# Patient Record
Sex: Male | Born: 1990 | Race: Black or African American | Hispanic: No | Marital: Single | Smoking: Current every day smoker
Health system: Southern US, Community
[De-identification: ages and names within clinical notes are randomized; demographics above are authoritative.]

## PROBLEM LIST (undated history)

## (undated) HISTORY — PX: OTHER SURGICAL HISTORY: SHX169

---
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
## 2014-06-04 ENCOUNTER — Emergency Department (HOSPITAL_COMMUNITY): Payer: Self-pay

## 2014-06-04 ENCOUNTER — Emergency Department (HOSPITAL_COMMUNITY)
Admission: EM | Admit: 2014-06-04 | Discharge: 2014-06-05 | Disposition: A | Discharge status: Home / Self Care | Payer: Self-pay | Attending: Emergency Medicine | Admitting: Emergency Medicine

## 2014-06-04 ENCOUNTER — Encounter (HOSPITAL_COMMUNITY): Payer: Self-pay | Admitting: Emergency Medicine

## 2014-06-04 DIAGNOSIS — F17200 Nicotine dependence, unspecified, uncomplicated: Secondary | ICD-10-CM | POA: Insufficient documentation

## 2014-06-04 DIAGNOSIS — J189 Pneumonia, unspecified organism: Secondary | ICD-10-CM

## 2014-06-04 DIAGNOSIS — J159 Unspecified bacterial pneumonia: Secondary | ICD-10-CM | POA: Insufficient documentation

## 2014-06-04 DIAGNOSIS — R042 Hemoptysis: Secondary | ICD-10-CM | POA: Insufficient documentation

## 2014-06-04 LAB — URINALYSIS, ROUTINE W REFLEX MICROSCOPIC
Bilirubin Urine: NEGATIVE
Glucose, UA: NEGATIVE mg/dL
Hgb urine dipstick: NEGATIVE
Ketones, ur: NEGATIVE mg/dL
Leukocytes, UA: NEGATIVE
Nitrite: NEGATIVE
Protein, ur: NEGATIVE mg/dL
Specific Gravity, Urine: 1.024 (ref 1.005–1.030)
Urobilinogen, UA: 0.2 mg/dL (ref 0.0–1.0)
pH: 7 (ref 5.0–8.0)

## 2014-06-04 LAB — COMPREHENSIVE METABOLIC PANEL
ALT: 14 U/L (ref 0–53)
AST: 14 U/L (ref 0–37)
Albumin: 3.4 g/dL — ABNORMAL LOW (ref 3.5–5.2)
Alkaline Phosphatase: 63 U/L (ref 39–117)
Anion gap: 12 (ref 5–15)
BUN: 11 mg/dL (ref 6–23)
CO2: 26 mEq/L (ref 19–32)
Calcium: 9.4 mg/dL (ref 8.4–10.5)
Chloride: 101 mEq/L (ref 96–112)
Creatinine, Ser: 0.77 mg/dL (ref 0.50–1.35)
GFR calc Af Amer: >90 mL/min (ref >90)
GFR calc non Af Amer: >90 mL/min (ref >90)
Glucose, Bld: 87 mg/dL (ref 70–99)
Potassium: 3.8 mEq/L (ref 3.7–5.3)
Sodium: 139 mEq/L (ref 137–147)
Total Bilirubin: <0.2 mg/dL — ABNORMAL LOW (ref 0.3–1.2)
Total Protein: 7.1 g/dL (ref 6.0–8.3)

## 2014-06-04 LAB — CBC WITH DIFFERENTIAL/PLATELET
Basophils Absolute: 0 10*3/uL (ref 0.0–0.1)
Basophils Relative: 0 % (ref 0–1)
Eosinophils Absolute: 0.2 10*3/uL (ref 0.0–0.7)
Eosinophils Relative: 4 % (ref 0–5)
HCT: 40.7 % (ref 39.0–52.0)
Hemoglobin: 13.7 g/dL (ref 13.0–17.0)
Lymphocytes Relative: 32 % (ref 12–46)
Lymphs Abs: 2.1 10*3/uL (ref 0.7–4.0)
MCH: 28.4 pg (ref 26.0–34.0)
MCHC: 33.7 g/dL (ref 30.0–36.0)
MCV: 84.4 fL (ref 78.0–100.0)
Monocytes Absolute: 0.4 10*3/uL (ref 0.1–1.0)
Monocytes Relative: 6 % (ref 3–12)
Neutro Abs: 3.9 10*3/uL (ref 1.7–7.7)
Neutrophils Relative %: 58 % (ref 43–77)
Platelets: 317 10*3/uL (ref 150–400)
RBC: 4.82 MIL/uL (ref 4.22–5.81)
RDW: 13 % (ref 11.5–15.5)
WBC: 6.7 10*3/uL (ref 4.0–10.5)

## 2014-06-04 LAB — LIPASE, BLOOD: Lipase: 12 U/L (ref 11–59)

## 2014-06-04 MED ORDER — IOHEXOL 300 MG/ML  SOLN
80.0000 mL | Freq: Once | INTRAMUSCULAR | Status: AC | PRN
  Administered 2014-06-04: 80 mL via INTRAVENOUS

## 2014-06-04 MED ORDER — DEXTROSE 5 % IV SOLN
1.0000 g | Freq: Once | INTRAVENOUS | Status: AC
  Administered 2014-06-05: 1 g via INTRAVENOUS
  Filled 2014-06-04: qty 10

## 2014-06-04 NOTE — ED Notes (Signed)
Patient transported to CT 

## 2014-06-04 NOTE — ED Notes (Signed)
Pt presents with a productive cough with clear colored sputum and intermittent episodes of "coughing up blood clots." Pt also endorsees SOB with and with out exertion, nausea, fatigue, night sweats, poor po intake starting February 2015. Pt reports he started working in a [employer] facility in May but symptoms were already in effect. Pt is a daily smoker

## 2014-06-04 NOTE — ED Notes (Signed)
The pt is c/o chest congestion  With anterior  And posterior pain with coughing up blood  Since may when he had a cold

## 2014-06-04 NOTE — ED Notes (Signed)
Patient transported to X-ray 

## 2014-06-05 MED ORDER — AZITHROMYCIN 250 MG PO TABS: ORAL_TABLET | ORAL | Status: DC

## 2014-06-05 MED ORDER — PROMETHAZINE-DM 6.25-15 MG/5ML PO SYRP: 5.0000 mL | ORAL_SOLUTION | Freq: Four times a day (QID) | ORAL | Status: DC | PRN

## 2014-06-05 MED ORDER — GUAIFENESIN ER 1200 MG PO TB12: 1.0000 | ORAL_TABLET | Freq: Two times a day (BID) | ORAL | Status: DC

## 2014-06-05 NOTE — ED Provider Notes (Signed)
Medical screening examination/treatment/procedure(s) were performed by non-physician practitioner and as supervising physician I was immediately available for consultation/collaboration.   EKG Interpretation None        Layla Maw Ward, DO 06/05/14 1433

## 2014-06-05 NOTE — ED Provider Notes (Signed)
CSN: [id number]     Arrival date & time 06/04/14  1911 History   First MD Initiated Contact with Patient 06/04/14 2036     Chief Complaint  Patient presents with  . Hemoptysis     (Consider location/radiation/quality/duration/timing/severity/associated sxs/prior Treatment) HPI Patient presents to the emergency department with chest congestion and pain with coughing.  Patient, states she's also been coughing up blood.  The patient, states, that he had multiple infections over the winter and in May had a significant upper respiratory illness.  Patient, states, that he's also had some intermittent bleeding, in his stool.  Patient, states, that he has not had shortness of breath, nausea, vomiting, fever, diarrhea, weakness, dizziness, lightheadedness, rash, or syncope.  Nothing seems make the patient's condition, better or worse History reviewed. No pertinent past medical history. History reviewed. No pertinent past surgical history. No family history on file. History  Substance Use Topics  . Smoking status: Current Every Day Smoker  . Smokeless tobacco: Not on file  . Alcohol Use: Yes    Review of Systems All other systems negative except as documented in the HPI. All pertinent positives and negatives as reviewed in the HPI.   Allergies  Review of patient's allergies indicates no known allergies.  Home Medications   Prior to Admission medications   Medication Sig Start Date End Date Taking? Authorizing Provider  Chlorphen-Pseudoephed-APAP (COLD MEDICINE PLUS PO) Take 2 capsules by mouth daily as needed (for cold).   Yes Historical Provider, MD   BP 114/74  Pulse 49  Temp(Src) 97.6 F (36.4 C) (Oral)  Resp 16  Ht 6' (1.829 m)  Wt 150 lb (68.04 kg)  BMI 20.34 kg/m2  SpO2 100% Physical Exam  Nursing note and vitals reviewed. Constitutional: He is oriented to person, place, and time. He appears well-developed and well-nourished. No distress.  HENT:  Head: Normocephalic and  atraumatic.  Mouth/Throat: Oropharynx is clear and moist.  Eyes: Pupils are equal, round, and reactive to light.  Neck: Normal range of motion. Neck supple.  Cardiovascular: Normal rate, regular rhythm and normal heart sounds.  Exam reveals no gallop and no friction rub.   No murmur heard. Pulmonary/Chest: Effort normal and breath sounds normal. No respiratory distress. He has no wheezes. He has no rales.  Neurological: He is alert and oriented to person, place, and time.  Skin: Skin is warm and dry. No rash noted. No erythema.    ED Course  Procedures (including critical care time) Labs Review Labs Reviewed  COMPREHENSIVE METABOLIC PANEL - Abnormal; Notable for the following:    Albumin 3.4 (*)    Total Bilirubin <0.2 (*)    All other components within normal limits  LIPASE, BLOOD  CBC WITH DIFFERENTIAL  URINALYSIS, ROUTINE W REFLEX MICROSCOPIC    Imaging Review Dg Chest 2 View  06/04/2014   CLINICAL DATA:  Hemoptysis since May 2015, chest pain with coughing  EXAM: CHEST  2 VIEW  COMPARISON:  11/14/2008  FINDINGS: The heart size and vascular pattern are normal. Left lung is clear. There is infiltrate in the anterior right middle lobe which was not previously present. No pleural effusions.  IMPRESSION: Right middle lobe pneumonia. Radiographic follow-up after appropriate therapy recommended to ensure resolution. Given the clinical history, the possibility of postobstructive pneumonitis is not excluded in could be further investigated with CT scan of the thorax, preferably with contrast.   Electronically Signed   By: Esperanza Heir M.D.   On: 06/04/2014 21:35   Ct  Chest W Contrast  06/04/2014   CLINICAL DATA:  Productive cough, intermittent hemoptysis. Shortness of breath. Fatigue.  EXAM: CT CHEST WITH CONTRAST  TECHNIQUE: Multidetector CT imaging of the chest was performed during intravenous contrast administration.  CONTRAST:  80mL OMNIPAQUE IOHEXOL 300 MG/ML  SOLN  COMPARISON:  Chest  radiograph June 04, 2014  FINDINGS: Heart and pericardium are unremarkable. Thoracic aorta is normal course and caliber, 2 vessel aortic arch is a normal variant. No lymphadenopathy by CT size criteria.  Tracheobronchial tree is patent, no pneumothorax. Trace bronchial wall thickening. No endobronchial lesions. Small area ground-glass patchy opacity in RIGHT upper lobe. Dense consolidation RIGHT middle lobe is seen on prior radiograph. Patchy areas of ground-glass opacities in the RIGHT lower lobe and lingula. No pleural effusions.  Included view of the abdomen is unremarkable. Visualized soft tissues and included osseous structures appear normal.  IMPRESSION: RIGHT greater than LEFT lung multifocal bronchopneumonia.   Electronically Signed   By: Awilda Metro   On: 06/04/2014 23:30    Patient will be antibiotics and followup with the [facility].  His best return here as needed.  Told to increase his fluid intake, and rest as much as possible.  Patient has been stable here in the emergency department and in no acute distress    Carlyle Dolly, PA-C 06/05/14 [id number]

## 2014-06-05 NOTE — Discharge Instructions (Signed)
Return here as needed.  Followup with the clinic provided.  Increase your fluid intake, and rest as much as possible

## 2014-06-20 ENCOUNTER — Encounter: Payer: Self-pay | Admitting: Internal Medicine

## 2014-06-20 ENCOUNTER — Ambulatory Visit: Payer: Self-pay | Attending: Internal Medicine | Admitting: Internal Medicine

## 2014-06-20 VITALS — BP 123/79 | HR 71 | Temp 98.3°F | Resp 16 | Wt 150.2 lb

## 2014-06-20 DIAGNOSIS — F17200 Nicotine dependence, unspecified, uncomplicated: Secondary | ICD-10-CM

## 2014-06-20 DIAGNOSIS — Z23 Encounter for immunization: Secondary | ICD-10-CM | POA: Insufficient documentation

## 2014-06-20 DIAGNOSIS — J189 Pneumonia, unspecified organism: Secondary | ICD-10-CM

## 2014-06-20 DIAGNOSIS — Z8701 Personal history of pneumonia (recurrent): Secondary | ICD-10-CM | POA: Insufficient documentation

## 2014-06-20 DIAGNOSIS — IMO0001 Reserved for inherently not codable concepts without codable children: Secondary | ICD-10-CM | POA: Insufficient documentation

## 2014-06-20 DIAGNOSIS — F17210 Nicotine dependence, cigarettes, uncomplicated: Secondary | ICD-10-CM | POA: Insufficient documentation

## 2014-06-20 DIAGNOSIS — Z720 Tobacco use: Secondary | ICD-10-CM

## 2014-06-20 DIAGNOSIS — Z139 Encounter for screening, unspecified: Secondary | ICD-10-CM

## 2014-06-20 LAB — COMPLETE METABOLIC PANEL WITH GFR
ALBUMIN: 4.7 g/dL (ref 3.5–5.2)
ALT: 13 U/L (ref 0–53)
AST: 15 U/L (ref 0–37)
Alkaline Phosphatase: 65 U/L (ref 39–117)
BUN: 7 mg/dL (ref 6–23)
CALCIUM: 9.7 mg/dL (ref 8.4–10.5)
CHLORIDE: 100 meq/L (ref 96–112)
CO2: 31 meq/L (ref 19–32)
Creat: 1.03 mg/dL (ref 0.50–1.35)
GFR, Est Non African American: >89 mL/min
Glucose, Bld: 79 mg/dL (ref 70–99)
POTASSIUM: 4.4 meq/L (ref 3.5–5.3)
Sodium: 139 mEq/L (ref 135–145)
Total Bilirubin: 0.5 mg/dL (ref 0.2–1.2)
Total Protein: 7.4 g/dL (ref 6.0–8.3)

## 2014-06-20 LAB — LIPID PANEL
CHOL/HDL RATIO: 1.8 ratio
Cholesterol: 143 mg/dL (ref 0–200)
HDL: 79 mg/dL (ref >39)
LDL Cholesterol: 49 mg/dL (ref 0–99)
Triglycerides: 74 mg/dL (ref <150)
VLDL: 15 mg/dL (ref 0–40)

## 2014-06-20 MED ORDER — NICOTINE 21 MG/24HR TD PT24: 21.0000 mg | MEDICATED_PATCH | Freq: Every day | TRANSDERMAL | Status: DC

## 2014-06-20 NOTE — Progress Notes (Signed)
Patient here to establish care Patient was recently seen in the ed for pneumonia

## 2014-06-20 NOTE — Progress Notes (Signed)
Patient Demographics  James Rosales, is a 23 y.o. male  ZOX:[id number]:[id number]  [id number]  DOB - 08/24/1991  CC:  Chief Complaint  Patient presents with  . Establish Care       HPI: James Rosales is a 23 y.o. male here today to establish medical care. Last month patient went to the emergency room with symptoms of fever chills coughing hemoptysis, EMR reviewed, patient had a chest x-ray and CT scan done was diagnosed with community-acquired pneumonia and was prescribed azithromycin which she has already completed denies any fever chills reports improvement in the symptoms patient does smoke cigarettes have advised patient to quit smoking. Patient has No headache, No chest pain, No abdominal pain - No Nausea, No new weakness tingling or numbness, No Cough - SOB.  No Known Allergies History reviewed. No pertinent past medical history. Current Outpatient Prescriptions on File Prior to Visit  Medication Sig Dispense Refill  . azithromycin (ZITHROMAX) 250 MG tablet 2 PO day and 1 PO day 2-5  6 tablet  0  . Chlorphen-Pseudoephed-APAP (COLD MEDICINE PLUS PO) Take 2 capsules by mouth daily as needed (for cold).      . Guaifenesin 1200 MG TB12 Take 1 tablet (1,200 mg total) by mouth 2 (two) times daily.  20 each  0  . promethazine-dextromethorphan (PROMETHAZINE-DM) 6.25-15 MG/5ML syrup Take 5 mLs by mouth 4 (four) times daily as needed for cough.  120 mL  0   No current facility-administered medications on file prior to visit.   Family History  Problem Relation Age of Onset  . Cancer Father     lung cancer   . Stroke Maternal Uncle   . Cancer Maternal Grandmother   . Cancer Paternal Grandmother    History   Social History  . Marital Status: Single    Spouse Name: N/A    Number of Children: N/A  . Years of Education: N/A   Occupational History  . Not on file.   Social History Main Topics  . Smoking status: Current Every Day Smoker -- 0.50 packs/day  . Smokeless tobacco: Not  on file  . Alcohol Use: Yes  . Drug Use: Not on file  . Sexual Activity: Not on file   Other Topics Concern  . Not on file   Social History Narrative  . No narrative on file    Review of Systems: Constitutional: Negative for fever, chills, diaphoresis, activity change, appetite change and fatigue. HENT: Negative for ear pain, nosebleeds, congestion, facial swelling, rhinorrhea, neck pain, neck stiffness and ear discharge.  Eyes: Negative for pain, discharge, redness, itching and visual disturbance. Respiratory: Negative for cough, choking, chest tightness, shortness of breath, wheezing and stridor.  Cardiovascular: Negative for chest pain, palpitations and leg swelling. Gastrointestinal: Negative for abdominal distention. Genitourinary: Negative for dysuria, urgency, frequency, hematuria, flank pain, decreased urine volume, difficulty urinating and dyspareunia.  Musculoskeletal: Negative for back pain, joint swelling, arthralgia and gait problem. Neurological: Negative for dizziness, tremors, seizures, syncope, facial asymmetry, speech difficulty, weakness, light-headedness, numbness and headaches.  Hematological: Negative for adenopathy. Does not bruise/bleed easily. Psychiatric/Behavioral: Negative for hallucinations, behavioral problems, confusion, dysphoric mood, decreased concentration and agitation.    Objective:   Filed Vitals:   06/20/14 1530  BP: 123/79  Pulse: 71  Temp: 98.3 F (36.8 C)  Resp: 16    Physical Exam: Constitutional: Patient appears well-developed and well-nourished. No distress. HENT: Normocephalic, atraumatic, External right and left ear normal. Oropharynx is clear and moist.  Eyes:  Conjunctivae and EOM are normal. PERRLA, no scleral icterus. Neck: Normal ROM. Neck supple. No JVD. No tracheal deviation. No thyromegaly. CVS: RRR, S1/S2 +, no murmurs, no gallops, no carotid bruit.  Pulmonary: Effort and breath sounds normal, no stridor, rhonchi,  wheezes, rales.  Abdominal: Soft. BS +, no distension, tenderness, rebound or guarding.  Musculoskeletal: Normal range of motion. No edema and no tenderness.  Neuro: Alert. Normal reflexes, muscle tone coordination. No cranial nerve deficit. Skin: Skin is warm and dry. No rash noted. Not diaphoretic. No erythema. No pallor. Psychiatric: Normal mood and affect. Behavior, judgment, thought content normal.  Lab Results  Component Value Date   WBC 6.7 06/04/2014   HGB 13.7 06/04/2014   HCT 40.7 06/04/2014   MCV 84.4 06/04/2014   PLT 317 06/04/2014   Lab Results  Component Value Date   CREATININE 0.77 06/04/2014   BUN 11 06/04/2014   NA 139 06/04/2014   K 3.8 06/04/2014   CL 101 06/04/2014   CO2 26 06/04/2014    No results found for this basename: HGBA1C   Lipid Panel  No results found for this basename: chol, trig, hdl, cholhdl, vldl, ldlcalc       Assessment and plan:   1. Encounter for immunization Patient was given flu shot today.  2. CAP (community acquired pneumonia) Patient already completed a course of antibiotic vitals stable afebrile.  3. Smoking Prescribe nicotine patch. - nicotine (NICODERM CQ) 21 mg/24hr patch; Place 1 patch (21 mg total) onto the skin daily.  Dispense: 28 patch; Refill: 0  4. Screening Ordered baseline blood work. - COMPLETE METABOLIC PANEL WITH GFR - TSH - Lipid panel - Vit D  25 hydroxy (rtn osteoporosis monitoring) - Hemoglobin A1c     Health Maintenance   -Influenza shot given today   Return in about 3 months (around 09/20/2014).    Doris Cheadle, MD

## 2014-06-20 NOTE — Patient Instructions (Signed)
Smoking Cessation Quitting smoking is important to your health and has many advantages. However, it is not always easy to quit since nicotine is a very addictive drug. Oftentimes, people try 3 times or more before being able to quit. This document explains the best ways for you to prepare to quit smoking. Quitting takes hard work and a lot of effort, but you can do it. ADVANTAGES OF QUITTING SMOKING  You will live longer, feel better, and live better.  Your body will feel the impact of quitting smoking almost immediately.  Within 20 minutes, blood pressure decreases. Your pulse returns to its normal level.  After 8 hours, carbon monoxide levels in the blood return to normal. Your oxygen level increases.  After 24 hours, the chance of having a heart attack starts to decrease. Your breath, hair, and body stop smelling like smoke.  After 48 hours, damaged nerve endings begin to recover. Your sense of taste and smell improve.  After 72 hours, the body is virtually free of nicotine. Your bronchial tubes relax and breathing becomes easier.  After 2 to 12 weeks, lungs can hold more air. Exercise becomes easier and circulation improves.  The risk of having a heart attack, stroke, cancer, or lung disease is greatly reduced.  After 1 year, the risk of coronary heart disease is cut in half.  After 5 years, the risk of stroke falls to the same as a nonsmoker.  After 10 years, the risk of lung cancer is cut in half and the risk of other cancers decreases significantly.  After 15 years, the risk of coronary heart disease drops, usually to the level of a nonsmoker.  If you are pregnant, quitting smoking will improve your chances of having a healthy baby.  The people you live with, especially any children, will be healthier.  You will have extra money to spend on things other than cigarettes. QUESTIONS TO THINK ABOUT BEFORE ATTEMPTING TO QUIT You may want to talk about your answers with your  health care provider.  Why do you want to quit?  If you tried to quit in the past, what helped and what did not?  What will be the most difficult situations for you after you quit? How will you plan to handle them?  Who can help you through the tough times? Your family? Friends? A health care provider?  What pleasures do you get from smoking? What ways can you still get pleasure if you quit? Here are some questions to ask your health care provider:  How can you help me to be successful at quitting?  What medicine do you think would be best for me and how should I take it?  What should I do if I need more help?  What is smoking withdrawal like? How can I get information on withdrawal? GET READY  Set a quit date.  Change your environment by getting rid of all cigarettes, ashtrays, matches, and lighters in your home, car, or work. Do not let people smoke in your home.  Review your past attempts to quit. Think about what worked and what did not. GET SUPPORT AND ENCOURAGEMENT You have a better chance of being successful if you have help. You can get support in many ways.  Tell your family, friends, and coworkers that you are going to quit and need their support. Ask them not to smoke around you.  Get individual, group, or telephone counseling and support. Programs are available at local hospitals and health centers. Call   your local health department for information about programs in your area.  Spiritual beliefs and practices may help some smokers quit.  Download a "quit meter" on your computer to keep track of quit statistics, such as how long you have gone without smoking, cigarettes not smoked, and money saved.  Get a self-help book about quitting smoking and staying off tobacco. LEARN NEW SKILLS AND BEHAVIORS  Distract yourself from urges to smoke. Talk to someone, go for a walk, or occupy your time with a task.  Change your normal routine. Take a different route to work.  Drink tea instead of coffee. Eat breakfast in a different place.  Reduce your stress. Take a hot bath, exercise, or read a book.  Plan something enjoyable to do every day. Reward yourself for not smoking.  Explore interactive web-based programs that specialize in helping you quit. GET MEDICINE AND USE IT CORRECTLY Medicines can help you stop smoking and decrease the urge to smoke. Combining medicine with the above behavioral methods and support can greatly increase your chances of successfully quitting smoking.  Nicotine replacement therapy helps deliver nicotine to your body without the negative effects and risks of smoking. Nicotine replacement therapy includes nicotine gum, lozenges, inhalers, nasal sprays, and skin patches. Some may be available over-the-counter and others require a prescription.  Antidepressant medicine helps people abstain from smoking, but how this works is unknown. This medicine is available by prescription.  Nicotinic receptor partial agonist medicine simulates the effect of nicotine in your brain. This medicine is available by prescription. Ask your health care provider for advice about which medicines to use and how to use them based on your health history. Your health care provider will tell you what side effects to look out for if you choose to be on a medicine or therapy. Carefully read the information on the package. Do not use any other product containing nicotine while using a nicotine replacement product.  RELAPSE OR DIFFICULT SITUATIONS Most relapses occur within the first 3 months after quitting. Do not be discouraged if you start smoking again. Remember, most people try several times before finally quitting. You may have symptoms of withdrawal because your body is used to nicotine. You may crave cigarettes, be irritable, feel very hungry, cough often, get headaches, or have difficulty concentrating. The withdrawal symptoms are only temporary. They are strongest  when you first quit, but they will go away within 10-14 days. To reduce the chances of relapse, try to:  Avoid drinking alcohol. Drinking lowers your chances of successfully quitting.  Reduce the amount of caffeine you consume. Once you quit smoking, the amount of caffeine in your body increases and can give you symptoms, such as a rapid heartbeat, sweating, and anxiety.  Avoid smokers because they can make you want to smoke.  Do not let weight gain distract you. Many smokers will gain weight when they quit, usually less than 10 pounds. Eat a healthy diet and stay active. You can always lose the weight gained after you quit.  Find ways to improve your mood other than smoking. FOR MORE INFORMATION  www.smokefree.gov  Document Released: 08/26/2001 Document Revised: 01/16/2014 Document Reviewed: 12/11/2011 [employer] Patient Information 2015 [employer]. This information is not intended to replace advice given to you by your health care provider. Make sure you discuss any questions you have with your health care provider.  

## 2014-06-21 ENCOUNTER — Other Ambulatory Visit: Payer: Self-pay

## 2014-06-21 ENCOUNTER — Telehealth: Payer: Self-pay

## 2014-06-21 DIAGNOSIS — F17200 Nicotine dependence, unspecified, uncomplicated: Secondary | ICD-10-CM

## 2014-06-21 LAB — HEMOGLOBIN A1C
HEMOGLOBIN A1C: 5.8 % — AB (ref <5.7)
MEAN PLASMA GLUCOSE: 120 mg/dL — AB (ref <117)

## 2014-06-21 LAB — TSH: TSH: 0.789 u[IU]/mL (ref 0.350–4.500)

## 2014-06-21 LAB — VITAMIN D 25 HYDROXY (VIT D DEFICIENCY, FRACTURES): VIT D 25 HYDROXY: 42 ng/mL (ref 30–89)

## 2014-06-21 MED ORDER — NICOTINE 21 MG/24HR TD PT24: 21.0000 mg | MEDICATED_PATCH | Freq: Every day | TRANSDERMAL | Status: DC

## 2014-06-21 MED ORDER — PROMETHAZINE-DM 6.25-15 MG/5ML PO SYRP: 5.0000 mL | ORAL_SOLUTION | Freq: Four times a day (QID) | ORAL | Status: DC | PRN

## 2014-06-21 NOTE — Telephone Encounter (Signed)
Returned patient phone call Patient not available  Left message  On voice mail to return my call

## 2014-06-26 ENCOUNTER — Ambulatory Visit: Payer: Self-pay

## 2015-07-17 ENCOUNTER — Emergency Department (HOSPITAL_COMMUNITY)
Admission: EM | Admit: 2015-07-17 | Discharge: 2015-07-18 | Disposition: A | Discharge status: Home / Self Care | Payer: Self-pay | Attending: Emergency Medicine | Admitting: Emergency Medicine

## 2015-07-17 ENCOUNTER — Encounter (HOSPITAL_COMMUNITY): Payer: Self-pay | Admitting: Emergency Medicine

## 2015-07-17 DIAGNOSIS — Z23 Encounter for immunization: Secondary | ICD-10-CM | POA: Insufficient documentation

## 2015-07-17 DIAGNOSIS — Z720 Tobacco use: Secondary | ICD-10-CM | POA: Insufficient documentation

## 2015-07-17 DIAGNOSIS — Y9301 Activity, walking, marching and hiking: Secondary | ICD-10-CM | POA: Insufficient documentation

## 2015-07-17 DIAGNOSIS — R52 Pain, unspecified: Secondary | ICD-10-CM

## 2015-07-17 DIAGNOSIS — S199XXA Unspecified injury of neck, initial encounter: Secondary | ICD-10-CM | POA: Insufficient documentation

## 2015-07-17 DIAGNOSIS — S060X9A Concussion with loss of consciousness of unspecified duration, initial encounter: Secondary | ICD-10-CM | POA: Insufficient documentation

## 2015-07-17 DIAGNOSIS — S0181XA Laceration without foreign body of other part of head, initial encounter: Secondary | ICD-10-CM | POA: Insufficient documentation

## 2015-07-17 DIAGNOSIS — S0191XA Laceration without foreign body of unspecified part of head, initial encounter: Secondary | ICD-10-CM

## 2015-07-17 DIAGNOSIS — Y998 Other external cause status: Secondary | ICD-10-CM | POA: Insufficient documentation

## 2015-07-17 DIAGNOSIS — S3992XA Unspecified injury of lower back, initial encounter: Secondary | ICD-10-CM | POA: Insufficient documentation

## 2015-07-17 DIAGNOSIS — Y9289 Other specified places as the place of occurrence of the external cause: Secondary | ICD-10-CM | POA: Insufficient documentation
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
## 2015-07-18 ENCOUNTER — Emergency Department (HOSPITAL_COMMUNITY): Payer: Self-pay

## 2015-07-18 MED ORDER — TETANUS-DIPHTH-ACELL PERTUSSIS 5-2.5-18.5 LF-MCG/0.5 IM SUSP
0.5000 mL | Freq: Once | INTRAMUSCULAR | Status: AC
  Administered 2015-07-18: 0.5 mL via INTRAMUSCULAR
  Filled 2015-07-18: qty 0.5

## 2015-07-18 MED ORDER — ACETAMINOPHEN 500 MG PO TABS
1000.0000 mg | ORAL_TABLET | Freq: Once | ORAL | Status: AC
  Administered 2015-07-18: 1000 mg via ORAL
  Filled 2015-07-18: qty 2

## 2015-07-18 MED ORDER — LIDOCAINE-EPINEPHRINE 1 %-1:100000 IJ SOLN
20.0000 mL | Freq: Once | INTRAMUSCULAR | Status: AC
  Administered 2015-07-18: 20 mL via INTRADERMAL
  Filled 2015-07-18: qty 1

## 2015-07-18 MED ORDER — IBUPROFEN 800 MG PO TABS
800.0000 mg | ORAL_TABLET | Freq: Once | ORAL | Status: AC
  Administered 2015-07-18: 800 mg via ORAL
  Filled 2015-07-18: qty 1
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
## 2015-07-18 NOTE — ED Notes (Signed)
MD at bedside. 

## 2015-07-18 NOTE — ED Notes (Signed)
Patient transported to CT 

## 2015-07-18 NOTE — ED Notes (Signed)
Suture being preformed by EDP. Pt content and in NAD at the moment.
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
## 2015-07-18 NOTE — Discharge Instructions (Signed)
Stitches, Staples, or Adhesive Wound Closure  °Health care providers use stitches (sutures), staples, and certain glue (skin adhesives) to hold skin together while it heals (wound closure). You may need this treatment after you have surgery or if you cut your skin accidentally. These methods help your skin to heal more quickly and make it less likely that you will have a scar. A wound may take several months to heal completely.  °The type of wound you have determines when your wound gets closed. In most cases, the wound is closed as soon as possible (primary skin closure). Sometimes, closure is delayed so the wound can be cleaned and allowed to heal naturally. This reduces the chance of infection. Delayed closure may be needed if your wound:  °Is caused by a bite.  °Happened more than 6 hours ago.  °Involves loss of skin or the tissues under the skin.  °Has dirt or debris in it that cannot be removed.  °Is infected. °WHAT ARE THE DIFFERENT KINDS OF WOUND CLOSURES?  °There are many options for wound closure. The one that your health care provider uses depends on how deep and how large your wound is.  °Adhesive Glue  °To use this type of glue to close a wound, your health care provider holds the edges of the wound together and paints the glue on the surface of your skin. You may need more than one layer of glue. Then the wound may be covered with a light bandage (dressing).  °This type of skin closure may be used for small wounds that are not deep (superficial). Using glue for wound closure is less painful than other methods. It does not require a medicine that numbs the area (local anesthetic). This method also leaves nothing to be removed. Adhesive glue is often used for children and on facial wounds.  °Adhesive glue cannot be used for wounds that are deep, uneven, or bleeding. It is not used inside of a wound.  °Adhesive Strips  °These strips are made of sticky (adhesive), porous paper. They are applied across your  skin edges like a regular adhesive bandage. You leave them on until they fall off.  °Adhesive strips may be used to close very superficial wounds. They may also be used along with sutures to improve the closure of your skin edges.  °Sutures  °Sutures are the oldest method of wound closure. Sutures can be made from natural substances, such as silk, or from synthetic materials, such as nylon and steel. They can be made from a material that your body can break down as your wound heals (absorbable), or they can be made from a material that needs to be removed from your skin (nonabsorbable). They come in many different strengths and sizes.  °Your health care provider attaches the sutures to a steel needle on one end. Sutures can be passed through your skin, or through the tissues beneath your skin. Then they are tied and cut. Your skin edges may be closed in one continuous stitch or in separate stitches.  °Sutures are strong and can be used for all kinds of wounds. Absorbable sutures may be used to close tissues under the skin. The disadvantage of sutures is that they may cause skin reactions that lead to infection. Nonabsorbable sutures need to be removed.  °Staples  °When surgical staples are used to close a wound, the edges of your skin on both sides of the wound are brought close together. A staple is placed across the wound, and   an instrument secures the edges together. Staples are often used to close surgical cuts (incisions).  °Staples are faster to use than sutures, and they cause less skin reaction. Staples need to be removed using a tool that bends the staples away from your skin.  °HOW DO I CARE FOR MY WOUND CLOSURE?  °Take medicines only as directed by your health care provider.  °If you were prescribed an antibiotic medicine for your wound, finish it all even if you start to feel better.  °Use ointments or creams only as directed by your health care provider.  °Wash your hands with soap and water before and  after touching your wound.  °Do not soak your wound in water. Do not take baths, swim, or use a hot tub until your health care provider approves.  °Ask your health care provider when you can start showering. Cover your wound if directed by your health care provider.  °Do not take out your own sutures or staples.  °Do not pick at your wound. Picking can cause an infection.  °Keep all follow-up visits as directed by your health care provider. This is important. °HOW LONG WILL I HAVE MY WOUND CLOSURE?  °Leave adhesive glue on your skin until the glue peels away.  °Leave adhesive strips on your skin until the strips fall off.  °Absorbable sutures will dissolve within several days.  °Nonabsorbable sutures and staples must be removed. The location of the wound will determine how long they stay in. This can range from several days to a couple of weeks. °WHEN SHOULD I SEEK HELP FOR MY WOUND CLOSURE?  °Contact your health care provider if:  °You have a fever.  °You have chills.  °You have drainage, redness, swelling, or pain at your wound.  °There is a bad smell coming from your wound.  °The skin edges of your wound start to separate after your sutures have been removed.  °Your wound becomes thick, raised, and darker in color after your sutures come out (scarring). °This information is not intended to replace advice given to you by your health care provider. Make sure you discuss any questions you have with your health care provider.  °Document Released: 05/27/2001 Document Revised: 09/22/2014 Document Reviewed: 02/08/2014  °Elsevier Interactive Patient Education ©2016 [employer]  ° °

## 2016-10-03 ENCOUNTER — Emergency Department (HOSPITAL_COMMUNITY): Payer: Self-pay

## 2016-10-03 ENCOUNTER — Encounter (HOSPITAL_COMMUNITY): Payer: Self-pay | Admitting: *Deleted

## 2016-10-03 ENCOUNTER — Emergency Department (HOSPITAL_COMMUNITY)
Admission: EM | Admit: 2016-10-03 | Discharge: 2016-10-03 | Disposition: A | Discharge status: Home / Self Care | Payer: Self-pay | Attending: Physician Assistant | Admitting: Physician Assistant

## 2016-10-03 DIAGNOSIS — W002XXA Other fall from one level to another due to ice and snow, initial encounter: Secondary | ICD-10-CM | POA: Insufficient documentation

## 2016-10-03 DIAGNOSIS — Y929 Unspecified place or not applicable: Secondary | ICD-10-CM | POA: Insufficient documentation

## 2016-10-03 DIAGNOSIS — Y939 Activity, unspecified: Secondary | ICD-10-CM | POA: Insufficient documentation

## 2016-10-03 DIAGNOSIS — F17200 Nicotine dependence, unspecified, uncomplicated: Secondary | ICD-10-CM | POA: Insufficient documentation

## 2016-10-03 DIAGNOSIS — S83402A Sprain of unspecified collateral ligament of left knee, initial encounter: Secondary | ICD-10-CM | POA: Insufficient documentation

## 2016-10-03 DIAGNOSIS — Y999 Unspecified external cause status: Secondary | ICD-10-CM | POA: Insufficient documentation

## 2016-10-03 MED ORDER — IBUPROFEN 800 MG PO TABS
800.0000 mg | ORAL_TABLET | Freq: Once | ORAL | Status: AC
  Administered 2016-10-03: 800 mg via ORAL
  Filled 2016-10-03: qty 1

## 2016-10-03 MED ORDER — CYCLOBENZAPRINE HCL 10 MG PO TABS: 10.0000 mg | ORAL_TABLET | Freq: Two times a day (BID) | ORAL | 0 refills | Status: AC | PRN

## 2016-10-03 MED ORDER — IBUPROFEN 800 MG PO TABS: 800.0000 mg | ORAL_TABLET | Freq: Three times a day (TID) | ORAL | 0 refills | Status: AC

## 2016-10-03 NOTE — ED Provider Notes (Signed)
[facility]-EMERGENCY DEPT Provider Note    By signing my name below, I, Earmon PhoenixJennifer Waddell, attest that this documentation has been prepared under the direction and in the presence of Fayrene HelperBowie Lataria Courser, PA-C. Electronically Signed: Earmon PhoenixJennifer Waddell, ED Scribe. 10/03/16. 1:14 PM.    History   Chief Complaint Chief Complaint  Patient presents with  . Knee Pain    left    The history is provided by the patient and medical records. No language interpreter was used.    Ludger NuttingDevon Dannielle HuhFairley is a 26 y.o. male with past surgical h/o left ACL repair surgery who presents to the Emergency Department complaining of a left knee injury that occurred this morning secondary to a mechanical fall on ice. He reports associated severe throbbing pain that radiates all the way up to his left groin. Pt rates the pain at 10/10. He states when he fell his left leg went underneath him and he heard a popping sound. He has not taken anything for pain relief. Bearing weight or trying to flex the knee increase his pain. Pt denies alleviating factors. He denies head trauma, LOC, left hip pain, numbness, tingling or weakness of the LLE.   History reviewed. No pertinent past medical history.  Patient Active Problem List   Diagnosis Date Noted  . CAP (community acquired pneumonia) 06/20/2014  . Smoking 06/20/2014    Past Surgical History:  Procedure Laterality Date  . left knee surgery          Home Medications    Prior to Admission medications   Medication Sig Start Date End Date Taking? Authorizing Provider  azithromycin (ZITHROMAX) 250 MG tablet 2 PO day and 1 PO day 2-5 Patient not taking: Reported on 07/18/2015 06/05/14   Charlestine Nighthristopher Lawyer, PA-C  Guaifenesin 1200 MG TB12 Take 1 tablet (1,200 mg total) by mouth 2 (two) times daily. Patient not taking: Reported on 07/18/2015 06/05/14   Charlestine Nighthristopher Lawyer, PA-C  nicotine (NICODERM CQ) 21 mg/24hr patch Place 1 patch (21 mg total) onto the skin daily. Patient not taking:  Reported on 07/18/2015 06/21/14   Doris Cheadleeepak Advani, MD  promethazine-dextromethorphan (PROMETHAZINE-DM) 6.25-15 MG/5ML syrup Take 5 mLs by mouth 4 (four) times daily as needed for cough. Patient not taking: Reported on 07/18/2015 06/21/14   Doris Cheadleeepak Advani, MD    Family History Family History  Problem Relation Age of Onset  . Cancer Father     lung cancer   . Stroke Maternal Uncle   . Cancer Maternal Grandmother   . Cancer Paternal Grandmother     Social History Social History  Substance Use Topics  . Smoking status: Current Every Day Smoker    Packs/day: 0.50  . Smokeless tobacco: Never Used  . Alcohol use Yes     Allergies   Patient has no known allergies.   Review of Systems Review of Systems  Musculoskeletal: Positive for arthralgias and joint swelling.  Skin: Positive for wound. Negative for color change.  Neurological: Negative for syncope, weakness and numbness.     Physical Exam Updated Vital Signs BP 117/70 (BP Location: Left Arm)   Pulse 67   Temp 97.6 F (36.4 C) (Oral)   Resp 14   Ht 6' (1.829 m)   Wt 156 lb (70.8 kg)   SpO2 98%   BMI 21.16 kg/m   Physical Exam  Constitutional: He is oriented to person, place, and time. He appears well-developed and well-nourished.  HENT:  Head: Normocephalic and atraumatic.  Neck: Normal range of motion.  Cardiovascular: Normal  rate.   Pulmonary/Chest: Effort normal.  Musculoskeletal: He exhibits edema and tenderness. He exhibits no deformity.  Exquisite tenderness to palpation noted to medial joint line as well as anterior knee. Small abrasion and edema noted. Decreased flexion and extension secondary to pain. No gross deformity. Patella located.  Neurological: He is alert and oriented to person, place, and time.  Skin: Skin is warm and dry.  Psychiatric: He has a normal mood and affect. His behavior is normal.  Nursing note and vitals reviewed.    ED Treatments / Results  DIAGNOSTIC STUDIES: Oxygen Saturation  is 98% on RA, normal by my interpretation.   COORDINATION OF CARE: 12:39 PM- Will X-Ray left knee and order Ibuprofen prior to imaging. Pt verbalizes understanding and agrees to plan.  Medications  ibuprofen (ADVIL,MOTRIN) tablet 800 mg (800 mg Oral Given 10/03/16 1302)    Labs (all labs ordered are listed, but only abnormal results are displayed) Labs Reviewed - No data to display  EKG  EKG Interpretation None       Radiology Dg Knee Complete 4 Views Left  Result Date: 10/03/2016 CLINICAL DATA:  Larey Seat on ice, pain be low patella EXAM: LEFT KNEE - COMPLETE 4+ VIEW COMPARISON:  12/07/2006 FINDINGS: Four views of the left knee submitted. Postsurgical changes are noted post ACL repair distal femur and proximal tibia. No acute fracture or subluxation. Small joint effusion. Mild prepatellar soft tissue swelling. IMPRESSION: No acute fracture or subluxation. Small joint effusion. Mild prepatellar soft tissue swelling. Electronically Signed   By: Natasha Mead M.D.   On: 10/03/2016 13:01    Procedures Procedures (including critical care time)  Medications Ordered in ED Medications  ibuprofen (ADVIL,MOTRIN) tablet 800 mg (800 mg Oral Given 10/03/16 1302)     Initial Impression / Assessment and Plan / ED Course  I have reviewed the triage vital signs and the nursing notes.  Pertinent labs & imaging results that were available during my care of the patient were reviewed by me and considered in my medical decision making (see chart for details).     BP 117/70 (BP Location: Left Arm)   Pulse 67   Temp 97.6 F (36.4 C) (Oral)   Resp 14   Ht 6' (1.829 m)   Wt 70.8 kg   SpO2 98%   BMI 21.16 kg/m    I personally performed the services described in this documentation, which was scribed in my presence. The recorded information has been reviewed and is accurate.     Final Clinical Impressions(s) / ED Diagnoses   Final diagnoses:  Sprain of collateral ligament of left knee, initial  encounter    New Prescriptions New Prescriptions   CYCLOBENZAPRINE (FLEXERIL) 10 MG TABLET    Take 1 tablet (10 mg total) by mouth 2 (two) times daily as needed for muscle spasms.   IBUPROFEN (ADVIL,MOTRIN) 800 MG TABLET    Take 1 tablet (800 mg total) by mouth 3 (three) times daily.   1:18 PM Patient injured  His left knee. X-ray demonstrate no acute fracture subluxation of a small joint effusion were noted. Mild prepatellar soft tissue swelling. Finding of the x-ray suggest internal derangement versus sprain. Knee sleeves and crutches provided. Pain medication and muscle relaxant prescribed. Orthopedic referral given as needed. Return precaution discussed.   Fayrene Helper, PA-C 10/03/16 1322    Courteney Randall An, MD 10/03/16 [phone]

## 2016-10-03 NOTE — ED Triage Notes (Signed)
Patient reports he slipped and fell on the ice.  His left leg bent under him and he heard it pop.  He cannot bend the knee at time.  He has hx of acl surgery on same leg.  Patient did not take pain meds prior to arrival.  No other complaints.

## 2018-09-06 IMAGING — CR DG KNEE COMPLETE 4+V*L*
4 series · 4 of 4 positions shown · non-contrast
Comparison: 12/07/2006

CLINICAL DATA: Fell on ice, pain be low patella

EXAM:
LEFT KNEE - COMPLETE 4+ VIEW

[knee ap]
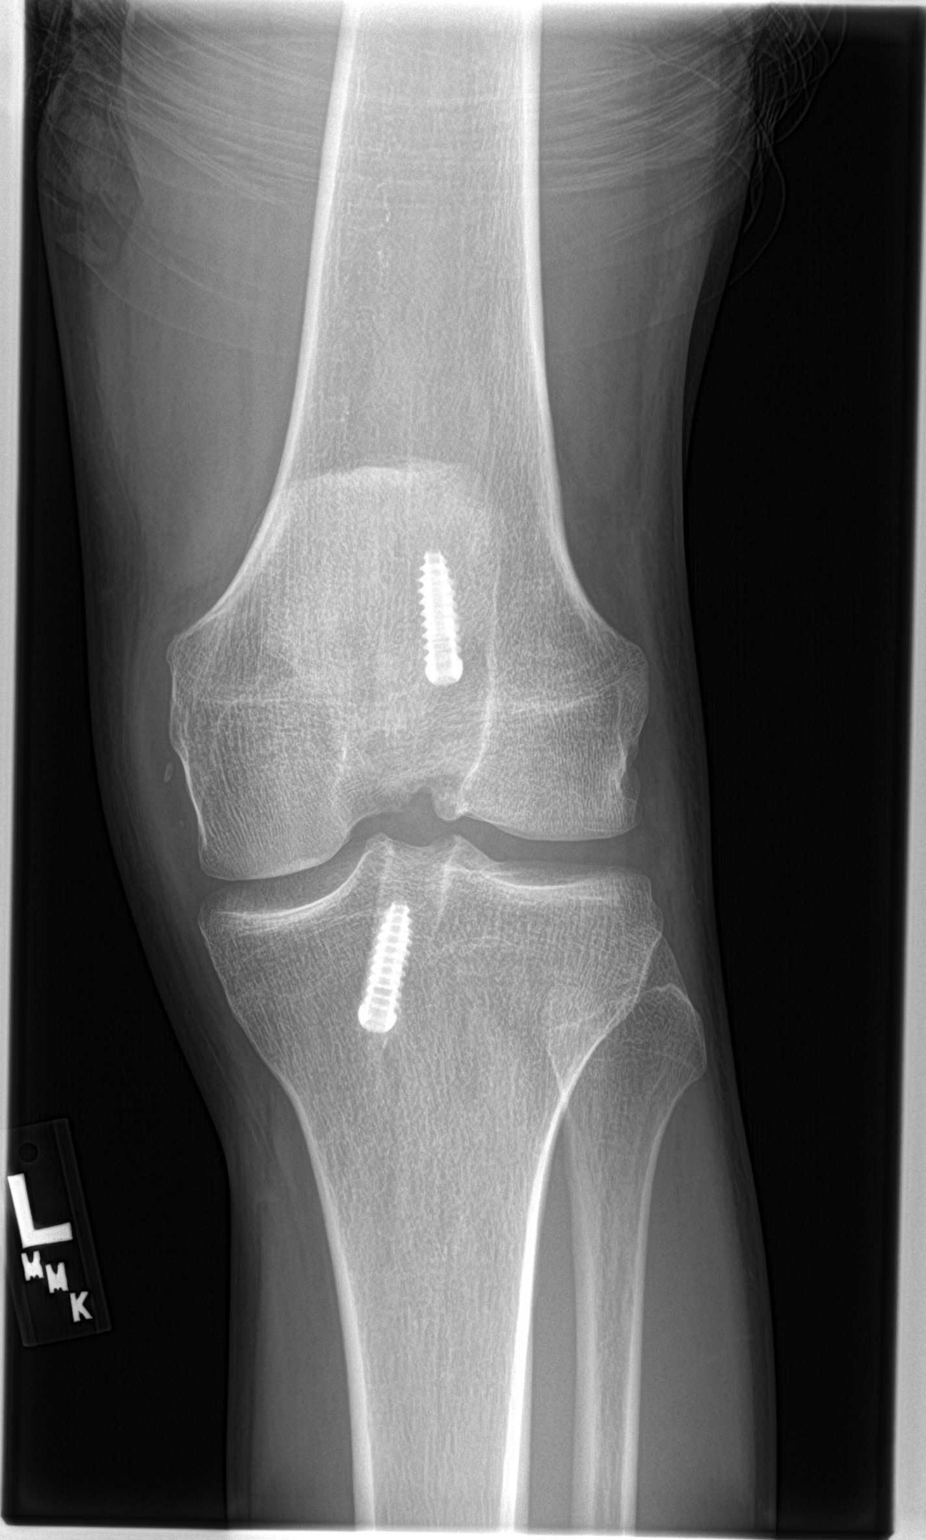

[knee lat]
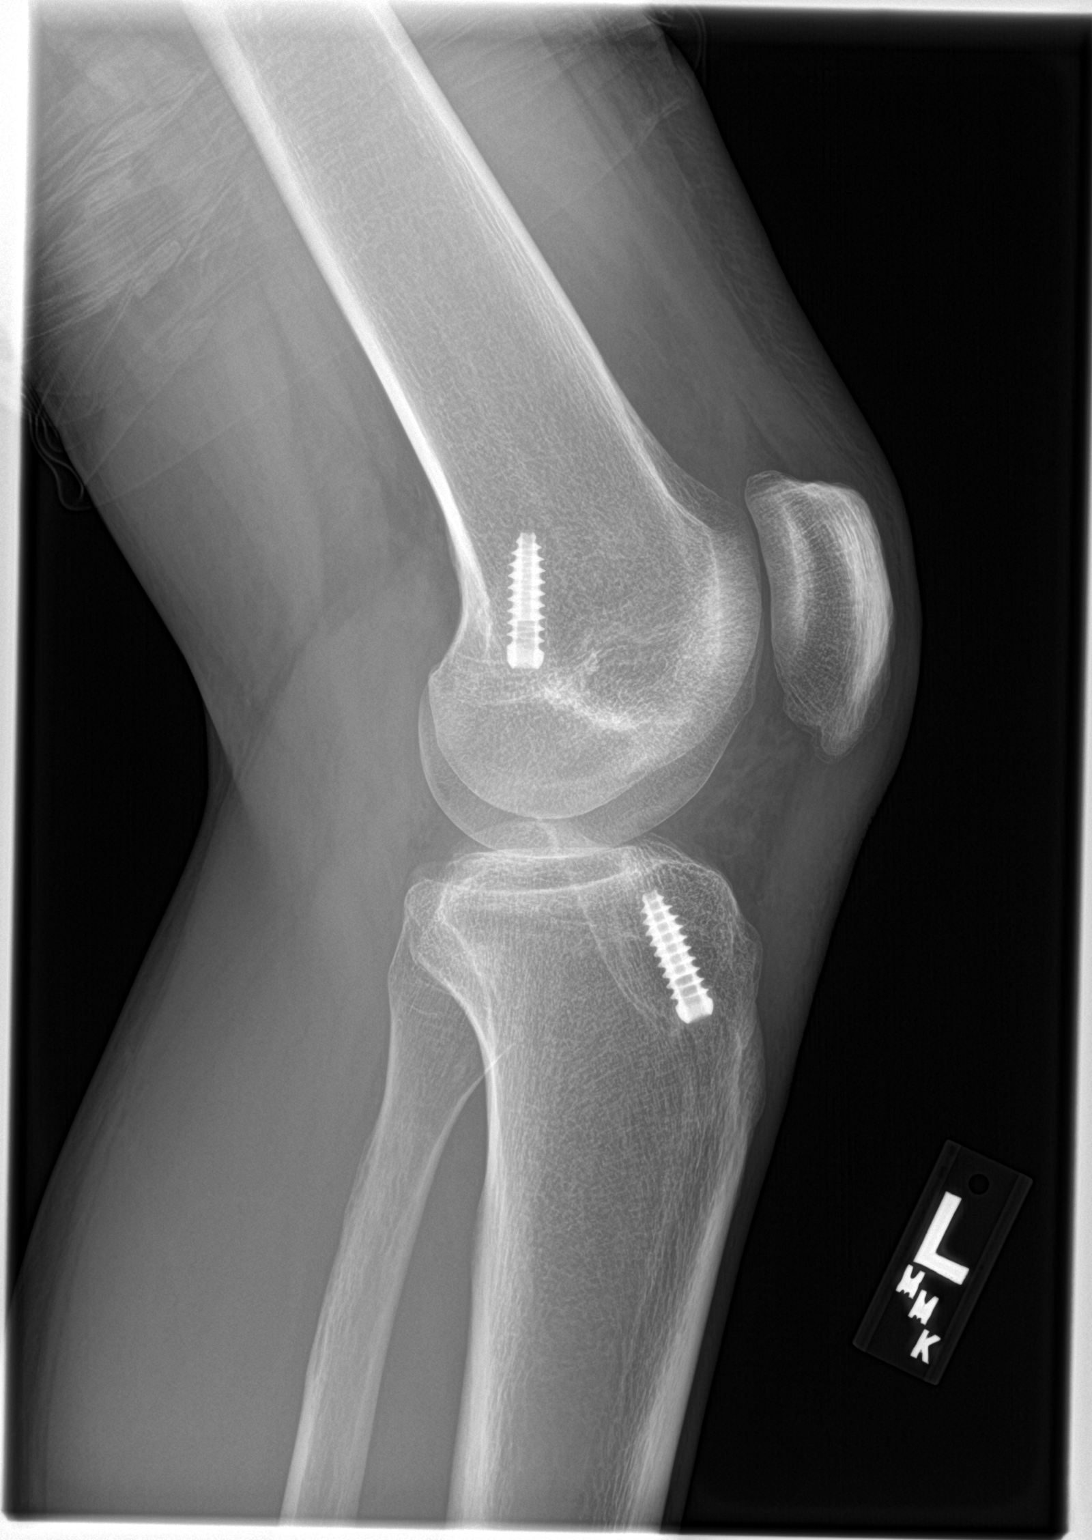

[knee obl (1 of 2)]
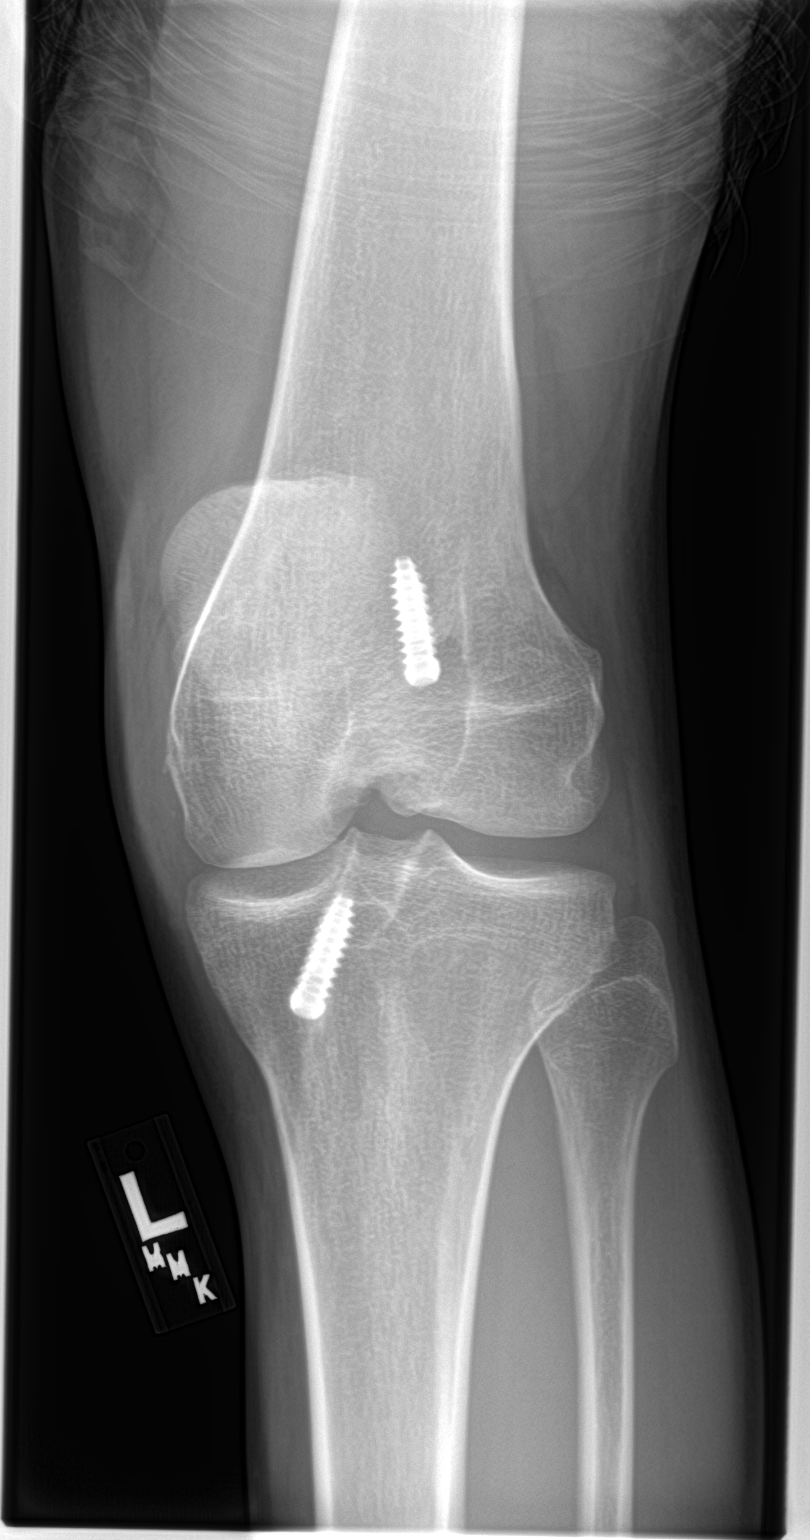

[knee obl (2 of 2)]
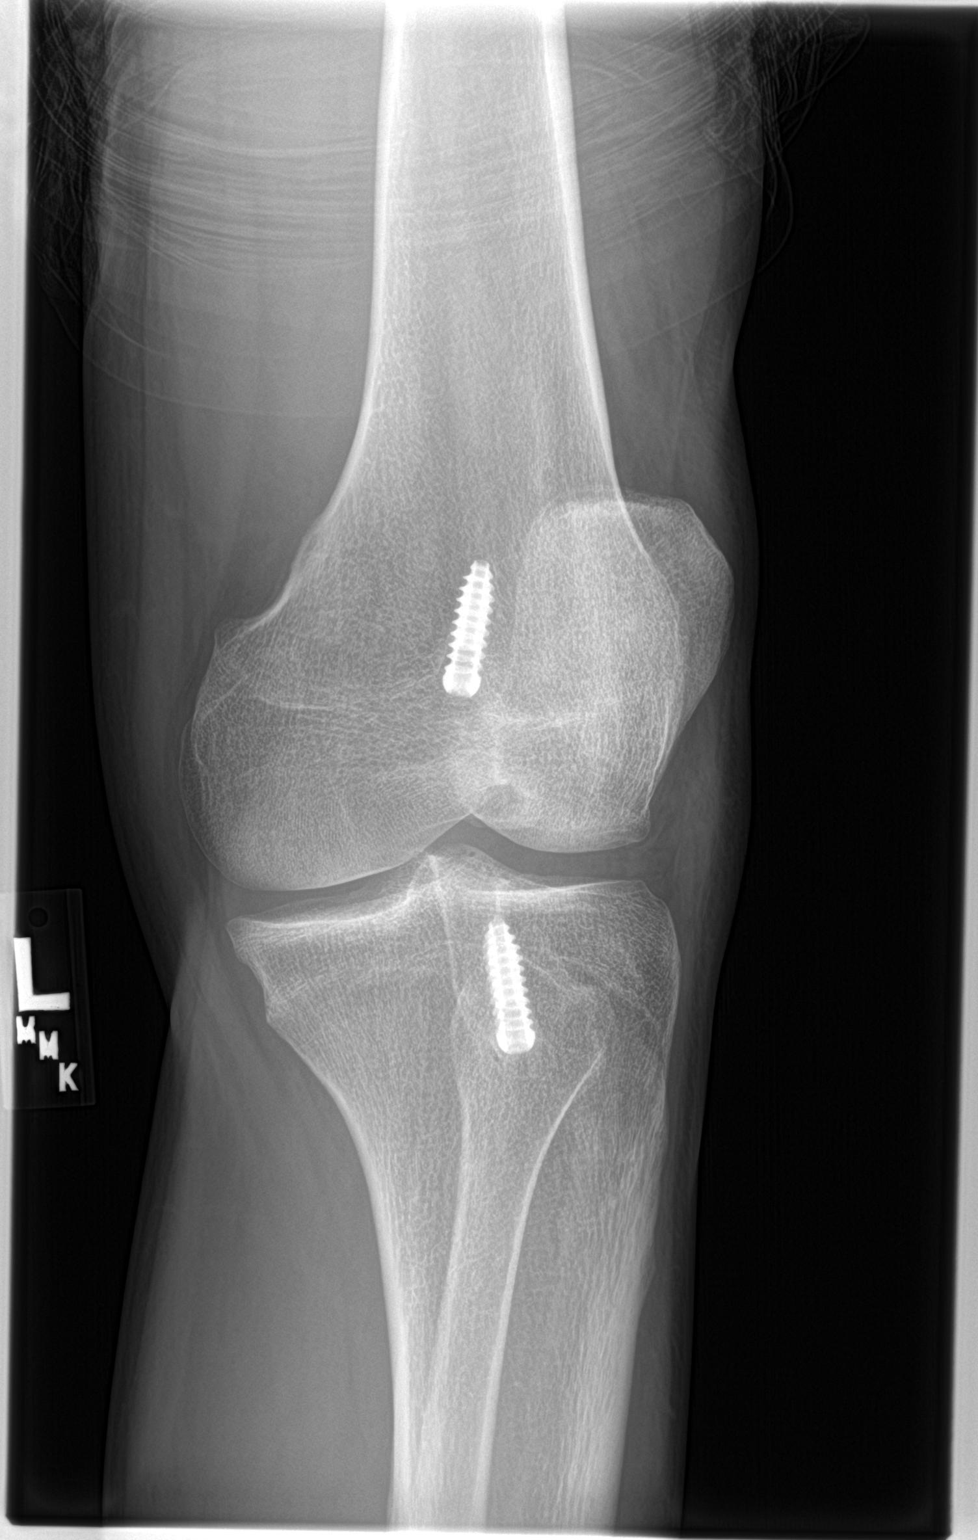

[4 of 4 positions shown; findings below may reference images not displayed]

FINDINGS: Four views of the left knee submitted. Postsurgical changes are
noted post ACL repair distal femur and proximal tibia. No acute
fracture or subluxation. Small joint effusion. Mild prepatellar soft
tissue swelling.
IMPRESSION: No acute fracture or subluxation. Small joint effusion. Mild
prepatellar soft tissue swelling.

## 2018-11-05 ENCOUNTER — Other Ambulatory Visit: Payer: Self-pay

## 2018-11-05 ENCOUNTER — Encounter (HOSPITAL_COMMUNITY): Payer: Self-pay | Admitting: Emergency Medicine

## 2018-11-05 ENCOUNTER — Emergency Department (HOSPITAL_COMMUNITY)
Admission: EM | Admit: 2018-11-05 | Discharge: 2018-11-05 | Disposition: A | Discharge status: Home / Self Care | Payer: Self-pay | Attending: Emergency Medicine | Admitting: Emergency Medicine

## 2018-11-05 ENCOUNTER — Emergency Department (HOSPITAL_COMMUNITY): Payer: Self-pay

## 2018-11-05 DIAGNOSIS — F17210 Nicotine dependence, cigarettes, uncomplicated: Secondary | ICD-10-CM | POA: Insufficient documentation

## 2018-11-05 DIAGNOSIS — R69 Illness, unspecified: Secondary | ICD-10-CM

## 2018-11-05 DIAGNOSIS — J111 Influenza due to unidentified influenza virus with other respiratory manifestations: Secondary | ICD-10-CM

## 2018-11-05 DIAGNOSIS — Z79899 Other long term (current) drug therapy: Secondary | ICD-10-CM | POA: Insufficient documentation

## 2018-11-05 DIAGNOSIS — J101 Influenza due to other identified influenza virus with other respiratory manifestations: Secondary | ICD-10-CM | POA: Insufficient documentation

## 2018-11-05 LAB — INFLUENZA PANEL BY PCR (TYPE A & B)
Influenza A By PCR: POSITIVE — AB
Influenza B By PCR: NEGATIVE

## 2018-11-05 MED ORDER — BENZONATATE 100 MG PO CAPS: 100.0000 mg | ORAL_CAPSULE | Freq: Three times a day (TID) | ORAL | 0 refills | Status: AC

## 2018-11-05 MED ORDER — ACETAMINOPHEN 325 MG PO TABS
650.0000 mg | ORAL_TABLET | Freq: Once | ORAL | Status: AC | PRN
  Administered 2018-11-05: 650 mg via ORAL
  Filled 2018-11-05: qty 2

## 2018-11-05 MED ORDER — OSELTAMIVIR PHOSPHATE 75 MG PO CAPS: 75.0000 mg | ORAL_CAPSULE | Freq: Two times a day (BID) | ORAL | 0 refills | Status: AC

## 2018-11-05 NOTE — ED Triage Notes (Signed)
Onset 2 days ago developed general body aches and productive cough yellow green sputum. States felt like he had a fever and took it at home 101.4 F oral. Did not take any OTC medication today but did take Mucinex yesterday.

## 2018-11-05 NOTE — ED Provider Notes (Signed)
[facility] EMERGENCY DEPARTMENT Provider Note   CSN: [id number] Arrival date & time: 11/05/18  1137    History   Chief Complaint Chief Complaint  Patient presents with  . Generalized Body Aches  . Cough    HPI James Rosales is a 28 y.o. male.     The history is provided by the patient. No language interpreter was used.  Cough     28 year old male with prior history of pneumonia presenting for evaluation of flulike symptoms.  For the past 2 days patient has had fever, chills, body aches, productive cough, headache, congestion, throat irritation, ear discomfort.  Symptoms moderate in severity, he has been taking Mucinex without adequate relief.  He has not had a flu shot.  He denies any recent sick contact.  He admits to tobacco use.  He denies any recent travel outside the country.  He report remote history of pneumonia 5 years ago.  History reviewed. No pertinent past medical history.  Patient Active Problem List   Diagnosis Date Noted  . CAP (community acquired pneumonia) 06/20/2014  . Smoking 06/20/2014    Past Surgical History:  Procedure Laterality Date  . left knee surgery           Home Medications    Prior to Admission medications   Medication Sig Start Date End Date Taking? Authorizing Provider  azithromycin (ZITHROMAX) 250 MG tablet 2 PO day and 1 PO day 2-5 Patient not taking: Reported on 07/18/2015 06/05/14   Charlestine Night, PA-C  cyclobenzaprine (FLEXERIL) 10 MG tablet Take 1 tablet (10 mg total) by mouth 2 (two) times daily as needed for muscle spasms. 10/03/16   Fayrene Helper, PA-C  Guaifenesin 1200 MG TB12 Take 1 tablet (1,200 mg total) by mouth 2 (two) times daily. Patient not taking: Reported on 07/18/2015 06/05/14   Charlestine Night, PA-C  ibuprofen (ADVIL,MOTRIN) 800 MG tablet Take 1 tablet (800 mg total) by mouth 3 (three) times daily. 10/03/16   Fayrene Helper, PA-C  nicotine (NICODERM CQ) 21 mg/24hr patch Place 1 patch (21 mg  total) onto the skin daily. Patient not taking: Reported on 07/18/2015 06/21/14   Doris Cheadle, MD  promethazine-dextromethorphan (PROMETHAZINE-DM) 6.25-15 MG/5ML syrup Take 5 mLs by mouth 4 (four) times daily as needed for cough. Patient not taking: Reported on 07/18/2015 06/21/14   Doris Cheadle, MD    Family History Family History  Problem Relation Age of Onset  . Cancer Father        lung cancer   . Stroke Maternal Uncle   . Cancer Maternal Grandmother   . Cancer Paternal Grandmother     Social History Social History   Tobacco Use  . Smoking status: Current Every Day Smoker    Packs/day: 0.50  . Smokeless tobacco: Never Used  Substance Use Topics  . Alcohol use: Yes  . Drug use: Never     Allergies   Patient has no known allergies.   Review of Systems Review of Systems  Respiratory: Positive for cough.   All other systems reviewed and are negative.    Physical Exam Updated Vital Signs BP 128/76 (BP Location: Right Arm)   Pulse 89   Temp (!) 101.3 F (38.5 C) (Oral)   Resp 16   Ht 6\' 1"  (1.854 m)   Wt 72.6 kg   SpO2 99%   BMI 21.11 kg/m   Physical Exam Vitals signs and nursing note reviewed.  Constitutional:      General: He is not  in acute distress.    Appearance: He is well-developed.  HENT:     Head: Atraumatic.     Right Ear: Tympanic membrane normal.     Left Ear: Tympanic membrane normal.     Nose: Nose normal.     Mouth/Throat:     Mouth: Mucous membranes are moist.  Eyes:     Conjunctiva/sclera: Conjunctivae normal.  Neck:     Musculoskeletal: Neck supple. No neck rigidity.  Cardiovascular:     Rate and Rhythm: Normal rate and regular rhythm.     Pulses: Normal pulses.     Heart sounds: Normal heart sounds.  Pulmonary:     Effort: Pulmonary effort is normal.     Breath sounds: Normal breath sounds.  Abdominal:     General: Abdomen is flat.     Palpations: Abdomen is soft.     Tenderness: There is no abdominal tenderness.    Lymphadenopathy:     Cervical: No cervical adenopathy.  Skin:    Findings: No rash.  Neurological:     Mental Status: He is alert and oriented to person, place, and time.      ED Treatments / Results  Labs (all labs ordered are listed, but only abnormal results are displayed) Labs Reviewed  INFLUENZA PANEL BY PCR (TYPE A & B)    EKG None  Radiology Dg Chest 2 View  Result Date: 11/05/2018 CLINICAL DATA:  Cough, fever. EXAM: CHEST - 2 VIEW COMPARISON:  Radiographs of June 04, 2014. FINDINGS: The heart size and mediastinal contours are within normal limits. Both lungs are clear. The visualized skeletal structures are unremarkable. IMPRESSION: No active cardiopulmonary disease. Electronically Signed   By: Lupita Raider, M.D.   On: 11/05/2018 13:04    Procedures Procedures (including critical care time)  Medications Ordered in ED Medications  acetaminophen (TYLENOL) tablet 650 mg (650 mg Oral Given 11/05/18 1154)     Initial Impression / Assessment and Plan / ED Course  I have reviewed the triage vital signs and the nursing notes.  Pertinent labs & imaging results that were available during my care of the patient were reviewed by me and considered in my medical decision making (see chart for details).        BP 128/76 (BP Location: Right Arm)   Pulse 89   Temp (!) 100.5 F (38.1 C) (Oral)   Resp 16   Ht 6\' 1"  (1.854 m)   Wt 72.6 kg   SpO2 99%   BMI 21.11 kg/m    Final Clinical Impressions(s) / ED Diagnoses   Final diagnoses:  Influenza-like illness    ED Discharge Orders    None     Patient with symptoms consistent with influenza.  Vitals are stable, low-grade fever.  No signs of dehydration, tolerating PO's.  Lungs are clear. Discussed the cost versus benefit of Tamiflu treatment with the patient.   Patient will be discharged with instructions to orally hydrate, rest, and use over-the-counter medications such as anti-inflammatories ibuprofen and  Aleve for muscle aches and Tylenol for fever.  Patient will also be given a cough suppressant.     Fayrene Helper, PA-C 11/05/18 1322    Gwyneth Sprout, MD 11/05/18 1451

## 2018-11-05 NOTE — ED Notes (Signed)
Patient transported to X-ray 

## 2020-10-08 IMAGING — CR DG CHEST 2V
2 series · 2 of 2 positions shown · non-contrast
Comparison: Radiographs June 04, 2014.

CLINICAL DATA: Cough, fever.

EXAM:
CHEST - 2 VIEW

[chest pa]
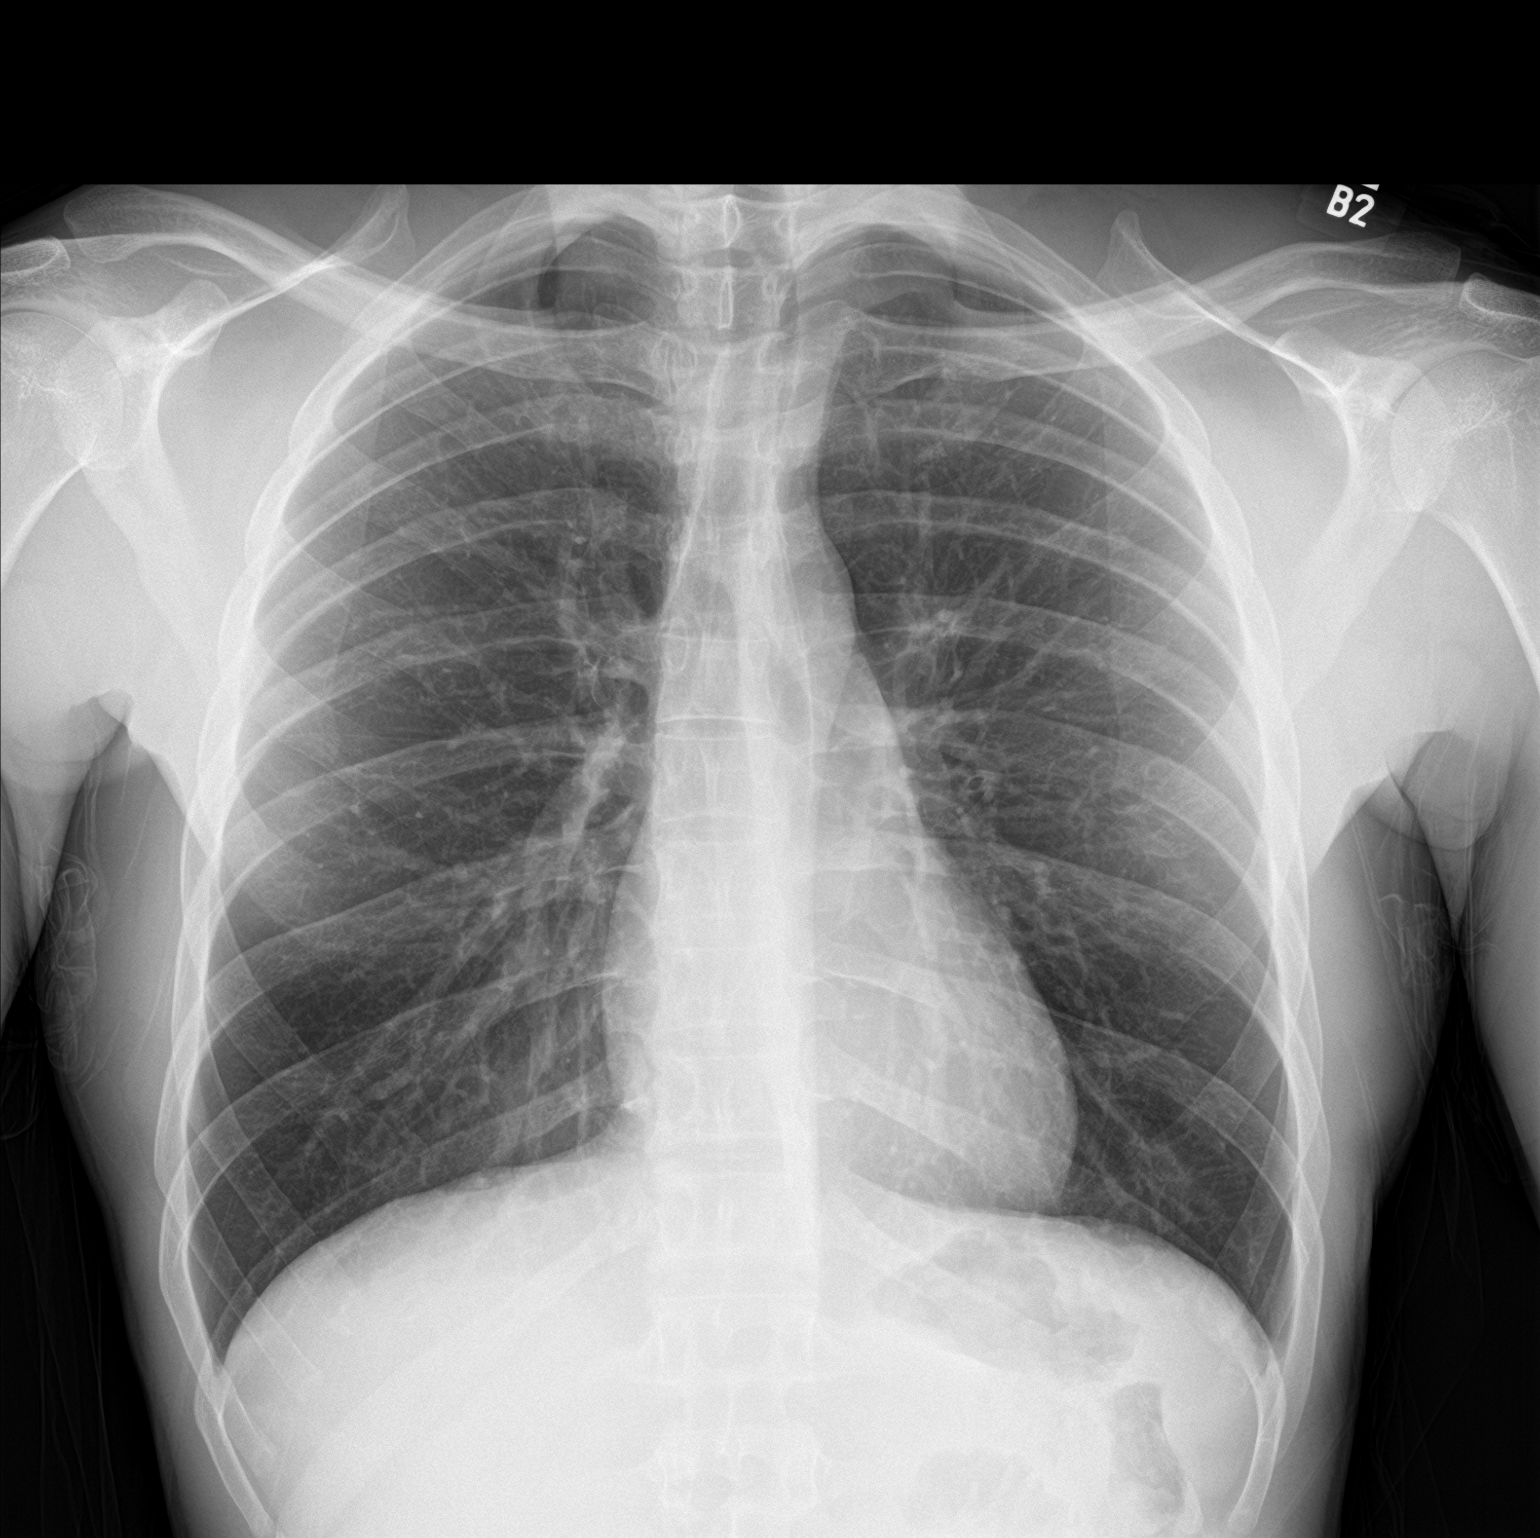

[chest lat]
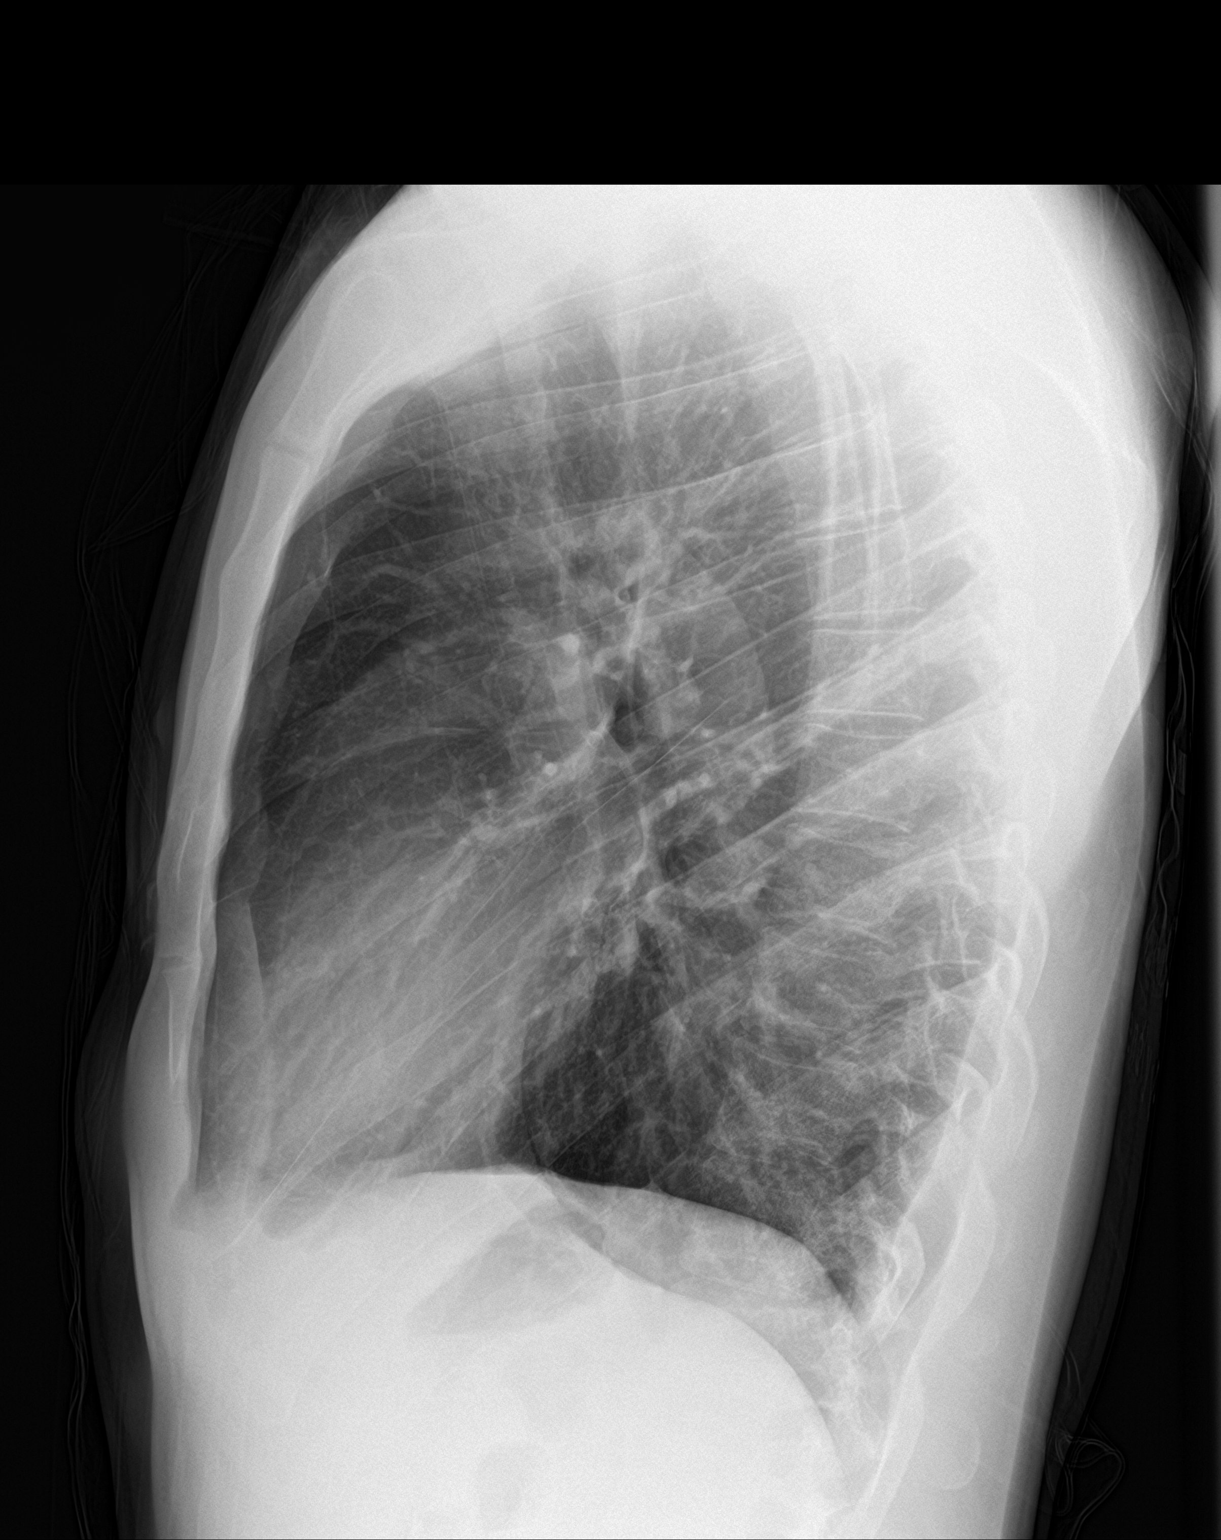

[2 of 2 positions shown; findings below may reference images not displayed]

FINDINGS: The heart size and mediastinal contours are within normal limits.
Both lungs are clear. The visualized skeletal structures are
unremarkable.
IMPRESSION: No active cardiopulmonary disease.
